# Patient Record
Sex: Female | Born: 1970 | Race: Black or African American | Hispanic: No | Marital: Married | Smoking: Current every day smoker
Health system: Southern US, Community
[De-identification: ages and names within clinical notes are randomized; demographics above are authoritative.]

---

## 2013-04-07 ENCOUNTER — Emergency Department (HOSPITAL_COMMUNITY)
Admission: EM | Admit: 2013-04-07 | Discharge: 2013-04-07 | Disposition: A | Discharge status: Home / Self Care | Payer: Self-pay | Attending: Emergency Medicine | Admitting: Emergency Medicine

## 2013-04-07 ENCOUNTER — Emergency Department (HOSPITAL_COMMUNITY): Payer: Self-pay

## 2013-04-07 ENCOUNTER — Encounter (HOSPITAL_COMMUNITY): Payer: Self-pay | Admitting: Emergency Medicine

## 2013-04-07 DIAGNOSIS — Z880 Allergy status to penicillin: Secondary | ICD-10-CM | POA: Insufficient documentation

## 2013-04-07 DIAGNOSIS — F17200 Nicotine dependence, unspecified, uncomplicated: Secondary | ICD-10-CM | POA: Insufficient documentation

## 2013-04-07 DIAGNOSIS — R079 Chest pain, unspecified: Secondary | ICD-10-CM | POA: Insufficient documentation

## 2013-04-07 LAB — CBC WITH DIFFERENTIAL/PLATELET
Basophils Absolute: 0 10*3/uL (ref 0.0–0.1)
Basophils Relative: 0 % (ref 0–1)
Eosinophils Absolute: 0 10*3/uL (ref 0.0–0.7)
Eosinophils Relative: 0 % (ref 0–5)
HCT: 40.9 % (ref 36.0–46.0)
Hemoglobin: 14.2 g/dL (ref 12.0–15.0)
Lymphocytes Relative: 19 % (ref 12–46)
Lymphs Abs: 1.8 10*3/uL (ref 0.7–4.0)
MCH: 30.9 pg (ref 26.0–34.0)
MCHC: 34.7 g/dL (ref 30.0–36.0)
MCV: 88.9 fL (ref 78.0–100.0)
Monocytes Absolute: 0.8 10*3/uL (ref 0.1–1.0)
Monocytes Relative: 8 % (ref 3–12)
Neutro Abs: 6.9 10*3/uL (ref 1.7–7.7)
Neutrophils Relative %: 72 % (ref 43–77)
Platelets: 212 10*3/uL (ref 150–400)
RBC: 4.6 MIL/uL (ref 3.87–5.11)
RDW: 12.8 % (ref 11.5–15.5)
WBC: 9.6 10*3/uL (ref 4.0–10.5)

## 2013-04-07 LAB — BASIC METABOLIC PANEL
BUN: 13 mg/dL (ref 6–23)
CO2: 27 mEq/L (ref 19–32)
Calcium: 10.1 mg/dL (ref 8.4–10.5)
Chloride: 103 mEq/L (ref 96–112)
Creatinine, Ser: 0.97 mg/dL (ref 0.50–1.10)
GFR calc Af Amer: 82 mL/min — ABNORMAL LOW (ref >90)
GFR calc non Af Amer: 71 mL/min — ABNORMAL LOW (ref >90)
Glucose, Bld: 83 mg/dL (ref 70–99)
Potassium: 3.8 mEq/L (ref 3.7–5.3)
Sodium: 142 mEq/L (ref 137–147)

## 2013-04-07 LAB — TROPONIN I: Troponin I: <0.3 ng/mL (ref <0.30)

## 2013-04-07 MED ORDER — ASPIRIN 325 MG PO TABS
650.0000 mg | ORAL_TABLET | Freq: Every day | ORAL | Status: DC
  Administered 2013-04-07: 650 mg via ORAL
  Filled 2013-04-07: qty 2

## 2013-04-07 MED ORDER — OXYCODONE-ACETAMINOPHEN 5-325 MG PO TABS
1.0000 | ORAL_TABLET | Freq: Once | ORAL | Status: AC
  Administered 2013-04-07: 1 via ORAL
  Filled 2013-04-07: qty 1

## 2013-04-07 NOTE — ED Notes (Signed)
The pt has had.  Lt breast pain since 1400 today no sob no nausea no dizziness.  She is also c/o a headsche

## 2013-04-07 NOTE — ED Notes (Signed)
Patient transported to X-ray 

## 2013-04-07 NOTE — ED Notes (Signed)
Reports she was carrying bags at 1400 today when she began experiencing left sided chest pain.  Feels like squeezing.  Denies increased pain with inspiration.  Nothing makes it better/worse.

## 2013-04-07 NOTE — ED Notes (Signed)
Returned from xray

## 2013-04-07 NOTE — Discharge Instructions (Signed)
Chest Pain (Nonspecific) °It is often hard to give a specific diagnosis for the cause of chest pain. There is always a chance that your pain could be related to something serious, such as a heart attack or a blood clot in the lungs. You need to follow up with your caregiver for further evaluation. °CAUSES  °· Heartburn. °· Pneumonia or bronchitis. °· Anxiety or stress. °· Inflammation around your heart (pericarditis) or lung (pleuritis or pleurisy). °· A blood clot in the lung. °· A collapsed lung (pneumothorax). It can develop suddenly on its own (spontaneous pneumothorax) or from injury (trauma) to the chest. °· Shingles infection (herpes zoster virus). °The chest wall is composed of bones, muscles, and cartilage. Any of these can be the source of the pain. °· The bones can be bruised by injury. °· The muscles or cartilage can be strained by coughing or overwork. °· The cartilage can be affected by inflammation and become sore (costochondritis). °DIAGNOSIS  °Lab tests or other studies, such as X-rays, electrocardiography, stress testing, or cardiac imaging, may be needed to find the cause of your pain.  °TREATMENT  °· Treatment depends on what may be causing your chest pain. Treatment may include: °· Acid blockers for heartburn. °· Anti-inflammatory medicine. °· Pain medicine for inflammatory conditions. °· Antibiotics if an infection is present. °· You may be advised to change lifestyle habits. This includes stopping smoking and avoiding alcohol, caffeine, and chocolate. °· You may be advised to keep your head raised (elevated) when sleeping. This reduces the chance of acid going backward from your stomach into your esophagus. °· Most of the time, nonspecific chest pain will improve within 2 to 3 days with rest and mild pain medicine. °HOME CARE INSTRUCTIONS  °· If antibiotics were prescribed, take your antibiotics as directed. Finish them even if you start to feel better. °· For the next few days, avoid physical  activities that bring on chest pain. Continue physical activities as directed. °· Do not smoke. °· Avoid drinking alcohol. °· Only take over-the-counter or prescription medicine for pain, discomfort, or fever as directed by your caregiver. °· Follow your caregiver's suggestions for further testing if your chest pain does not go away. °· Keep any follow-up appointments you made. If you do not go to an appointment, you could develop lasting (chronic) problems with pain. If there is any problem keeping an appointment, you must call to reschedule. °SEEK MEDICAL CARE IF:  °· You think you are having problems from the medicine you are taking. Read your medicine instructions carefully. °· Your chest pain does not go away, even after treatment. °· You develop a rash with blisters on your chest. °SEEK IMMEDIATE MEDICAL CARE IF:  °· You have increased chest pain or pain that spreads to your arm, neck, jaw, back, or abdomen. °· You develop shortness of breath, an increasing cough, or you are coughing up blood. °· You have severe back or abdominal pain, feel nauseous, or vomit. °· You develop severe weakness, fainting, or chills. °· You have a fever. °THIS IS AN EMERGENCY. Do not wait to see if the pain will go away. Get medical help at once. Call your local emergency services (911 in [place] Do not drive yourself to the hospital. °MAKE SURE YOU:  °· Understand these instructions. °· Will watch your condition. °· Will get help right away if you are not doing well or get worse. °Document Released: 10/20/2004 Document Revised: 04/04/2011 Document Reviewed: 08/16/2007 °ExitCare® Patient Information ©2014 [employer],   LLC. ° ° ° °Emergency Department Resource Guide °1) Find a Doctor and Pay Out of Pocket °Although you won't have to find out who is covered by your insurance plan, it is a good idea to ask around and get recommendations. You will then need to call the office and see if the doctor you have chosen will accept you as a new  patient and what types of options they offer for patients who are self-pay. Some doctors offer discounts or will set up payment plans for their patients who do not have insurance, but you will need to ask so you aren't surprised when you get to your appointment. ° °2) Contact Your Local Health Department °Not all health departments have doctors that can see patients for sick visits, but many do, so it is worth a call to see if yours does. If you don't know where your local health department is, you can check in your phone book. The CDC also has a tool to help you locate your state's health department, and many state websites also have listings of all of their local health departments. ° °3) Find a Walk-in Clinic °If your illness is not likely to be very severe or complicated, you may want to try a walk in clinic. These are popping up all over the country in pharmacies, drugstores, and shopping centers. They're usually staffed by nurse practitioners or physician assistants that have been trained to treat common illnesses and complaints. They're usually fairly quick and inexpensive. However, if you have serious medical issues or chronic medical problems, these are probably not your best option. ° °No Primary Care Doctor: °- Call [facility] at  832-8000 - they can help you locate a primary care doctor that  accepts your insurance, provides certain services, etc. °- Physician Referral Service- 1-800-533-3463 ° °Chronic Pain Problems: °Organization         Address  Phone   Notes  °[facility]  (336) 297-2271 Patients need to be referred by their primary care doctor.  ° °Medication Assistance: °Organization         Address  Phone   Notes  °[place] [facility] [address] °[place], [place] [address] (336) 641-8030 --Must be a resident of [place] °-- Must have NO insurance coverage whatsoever (no Medicaid/ Medicare, etc.) °-- The pt. MUST have a primary  care doctor that directs their care regularly and follows them in the community °  °[facility]  (866) 331-1348   °[employer]  (888) 892-1162   ° °Agencies that provide inexpensive medical care: °Organization         Address  Phone   Notes  °[facility]  (336) 832-8035   °[facility]    (336) 832-7272   °[facility] [address] °[place], [place] [address] (336) 832-4777   °[facility] [address], °[place] (336) 271-4999   °[facility]    (336) 373-0678   °[facility]    (336) 272-1050   °[facility] ° [address], [place] Phone:  (336) 832-4444, Fax:  (336) 832-4440 Hours of Operation:  9 am - 6 pm, M-F.  Also accepts Medicaid/Medicare and self-pay.  °[facility] ° [address], [place] Phone: (336) 832-3150, Fax: (336) 832-3151. Hours of Operation:  8:30 am - 5:30 pm, M-F.  Also accepts Medicaid and self-pay.  °  [facility] [place] [address], [place] Phone: (336) 878-6027   °[facility] [address], [place], [place] (336)723-1848, Ext. 123 Mondays & Thursdays: 7-9 AM.  First 15 patients are seen on a first come, first serve basis. °  ° °Medicaid-accepting [place] Providers: ° °Organization         Address  Phone   Notes  °[facility] [address], [place] (336) 641-2100 Also accepts self-pay patients.  °[facility] [address], [place] ° (336) 856-9996   °[facility] [address], [place] (336) 288-8857   °[facility] [address] [place] [address], [place] (336) 299-7000   °Veita Bland [address], [place]  ° (336) 373-1557 Only accepts [place] Access Medicaid patients after they have their name applied to their card.  ° °Self-Pay (no insurance) in [place]: ° °Organization         Address  Phone   Notes  °Sickle Cell Patients, [facility] [address], [place] (336) 832-1970   °[facility] [address], [place] (336) 832-4400   ° [facility] ° [address],  (336) 992-4800   °[facility]/Dr. Osei-Bonsu ° [address] [place] [address], [place] or [address], [place] (336) 841-8500 Phone number for both [place] and [place] locations is the same.  °[facility] [address], [place] (336) 299-0000   °[facility] [address] [place] [address], [place] or [address] (336) 852-7530 °(336) 878-2260   °[facility] [address], [place] (336) 350-1642, phone; (336) 294-5005, fax Sees patients 1st and 3rd Saturday of every month.  Must not qualify for public or private insurance (i.e. Medicaid, Medicare, [place] Health Choice, Veterans' Benefits) • Household income should be no more than 200% of the poverty level •The clinic cannot treat you if you are pregnant or think you are pregnant • Sexually transmitted diseases are not treated at the clinic.  ° ° °Dental Care: °Organization         Address  Phone  Notes  °[facility] [address], [place] (336) 641-6152 Accepts children up to age 21 who are enrolled in Medicaid or [place] Health Choice; pregnant women with a Medicaid card; and children who have applied for Medicaid or [place] Health Choice, but were declined, whose parents can pay a reduced fee at time of service.  °[facility] [place]  [address], [place] (336) 641-7733 Accepts children up to age 21 who are enrolled in Medicaid or [place] Health Choice; pregnant women with a Medicaid card; and children who have applied for Medicaid or [place] Health Choice, but were declined, whose parents can  pay a reduced fee at time of service.  °[facility] ° [address],  (336) 641-4533 Patients are seen by appointment only. Walk-ins are not accepted. [facility] will see patients 18 years of age and older. °Monday - Tuesday (8am-5pm) °Most Wednesdays (8:30-5pm) °$30 per visit, cash only  °[facility] ° [address], [place] (336) 641-4533 Patients are seen by appointment only. Walk-ins are not accepted. [facility] will see patients 18 years of   age and older. °One Wednesday Evening (Monthly: Volunteer Based).  $30 per visit, cash only  °[facility]  (919) 537-3737 for adults; Children under age 4, call Graduate Pediatric Dentistry at ([phone]) [phone]. Children aged 4-14, please call (919) [phone] to request a pediatric application. ° Dental services are provided in all areas of dental care including fillings, crowns and bridges, complete and partial dentures, implants, gum treatment, root canals, and extractions. Preventive care is also provided. Treatment is provided to both adults and children. °Patients are selected via a lottery and there is often a waiting list. °  °[facility] [address], °[place] ° (336) 763-8833 [email or web address] °  °[facility] [address], [place], [place] (336)723-1848, Ext. 123 Second and Fourth Thursday of each month, opens at 6:30 AM; Clinic ends at 9 AM.  Patients are seen on a first-come first-served basis, and a limited number are seen during each clinic.  ° °[facility] ° [address], [place], [place] (336) 723-7904   Eligibility Requirements °You must have lived in [place], [place], or [place] counties for at least the last three months. °  You cannot be eligible for state or federal sponsored healthcare insurance, including Veterans Administration, Medicaid, or Medicare. °  You generally cannot be eligible for healthcare  insurance through your employer.  °  How to apply: °Eligibility screenings are held every Tuesday and Wednesday afternoon from 1:00 pm until 4:00 pm. You do not need an appointment for the interview!  °[facility] [address], [place], [place] 336-631-2330   °[facility]  336-342-8273   °[facility]  336-703-3100   °[facility]  336-570-6415   ° °Behavioral Health Resources in the Community: °Intensive Outpatient Programs °Organization         Address  Phone  Notes  °[facility] [address], [place], [place] 336-878-6098   °[facility] [address], [place], [place] 336-832-9800   °[facility] [address], [place], [place] ° 336-882-2125   °[facility] [address],  °[place], [place] 1-800-853-5163 or 336-641-4981   °Substance Abuse Resources °Organization         Address  Phone  Notes  °[facility]  336-882-2125   °[facility]  336-784-9470   °[facility]  336-285-9073   °[facility]  336-845-3988   °Residential & Outpatient Substance Abuse Program  1-800-659-3381   °Psychological Services °Organization         Address  Phone  Notes  ° Health  336- 832-9600   °[facility]  336- 378-7881   °[facility] [address], [place] 1-800-853-5163 or 336-641-4981   ° °Mobile Crisis Teams °Organization         Address  Phone  Notes  °[facility], Mobile Crisis Care Unit  1-877-626-1772   °Assertive °[facility] ° [address] [place], [place] 336-834-9664   °Sharon DeEsch [address] °[place] 336-554-5454   ° °Self-Help/Support Groups °Organization         Address  Phone             Notes  °[facility]  - variety of support groups  336- 373-1402 Call for more information  °Narcotics Anonymous (NA),  [facility] [address], °[place]   2   meetings at this location  ° °Residential Treatment Programs °Organization         Address  Phone  Notes  °[facility] [address],    °[place]  1-866-801-8205   °[facility] ° [address], [place], [place] 704-293-8524   °[facility] [address], [place] 336-845-3988 Admissions: 8am-3pm M-F  °[facility] [address]    °[place], [place] 336-841-1104   °[facility] [address], [place], [place] 336-379-7146   °[facility] [address]  °[place], [place] 336-285-9073   °[facility] Intensive Outpatient [address], [place], [place] 336-852-3033   °[facility] [address]  °[place], [place] 1-877-615-2722 or 336-784-9470   °[facility] [address] [place], [place] 336-227-7417 Accepts Medicaid  °[facility] [address]  °[place] 1-800-659-3381 Substance Abuse/Addiction Treatment  ° °[place] Behavioral Health Resources °Organization         Address  Phone  Notes  °[facility]  (888) 581-9988   °Julie Brannon, PhD [address] [place], [place]   (336) 349-5553 or (336) 951-0000   °[facility]   [address] °[place], [place] (336) 349-4454   °[facility] [address], [place], [place] (336) 342-8316 Insurance/Medicaid/sponsorship through [facility]  °[facility] [address]                                    [place], [place] (336) 342-8316 Therapy/tele-psych/case  °[facility] [address]  ° [place], [place] (336) 349-2233    °Dr. Arfeen  (336) 349-4544   °[facility] [place]  [employer] [place] [facility] 1) [address], [place] °2) [address], [place] °3)  [address], [place] (336) 349-3220 °(336) 342-7768 ° °(336) 342-8140    °[place] [facility] (336) 342-1394 or (336) 342-3537 (After Hours)    ° ° ° °

## 2013-04-09 NOTE — ED Provider Notes (Signed)
CSN: [id number]     Arrival date & time 04/07/13  1820 History   First MD Initiated Contact with Patient 04/07/13 1903     Chief Complaint  Patient presents with  . Chest Pain     (Consider location/radiation/quality/duration/timing/severity/associated sxs/prior Treatment) HPI  43 year old female with chest pain. Left anterior chest. Pain is reproducible with elevation of her left breast. Denies any trauma. Pain is sharp. No appreciable exacerbating or relieving factors otherwise. No cough. No shortness of breath. No fevers or chills. No unusual leg pain or swelling. Past history of similar symptoms. Has not noticed any change with exertion. No nausea, diaphoresis or palpitations.. Smoker. No history of CAD.  History reviewed. No pertinent past medical history. History reviewed. No pertinent past surgical history. No family history on file. History  Substance Use Topics  . Smoking status: Current Every Day Smoker  . Smokeless tobacco: Not on file  . Alcohol Use: Yes   OB History   Grav Para Term Preterm Abortions TAB SAB Ect Mult Living                 Review of Systems  All systems reviewed and negative, other than as noted in HPI.   Allergies  Penicillins  Home Medications  No current outpatient prescriptions on file. BP 122/87  Pulse 90  Temp(Src) 97 F (36.1 C) (Oral)  Resp 18  Ht 5\' 5"  (1.651 m)  Wt 231 lb (104.781 kg)  BMI 38.44 kg/m2  SpO2 96%  LMP 03/25/2013 Physical Exam  Nursing note and vitals reviewed. Constitutional: She appears well-developed and well-nourished. No distress.  Laying in bed. No acute distress. Obese.  HENT:  Head: Normocephalic and atraumatic.  Eyes: Conjunctivae are normal. Right eye exhibits no discharge. Left eye exhibits no discharge.  Neck: Neck supple.  Cardiovascular: Normal rate, regular rhythm and normal heart sounds.  Exam reveals no gallop and no friction rub.   No murmur heard. Pulmonary/Chest: Effort normal and  breath sounds normal. No respiratory distress.  Abdominal: Soft. She exhibits no distension. There is no tenderness.  Musculoskeletal: She exhibits no edema and no tenderness.  Lower extremities symmetric as compared to each other. No calf tenderness. Negative Homan's. No palpable cords.   Neurological: She is alert.  Skin: Skin is warm and dry.  Psychiatric: She has a normal mood and affect. Her behavior is normal. Thought content normal.    ED Course  Procedures (including critical care time) Labs Review Labs Reviewed  BASIC METABOLIC PANEL - Abnormal; Notable for the following:    GFR calc non Af Amer 71 (*)    GFR calc Af Amer 82 (*)    All other components within normal limits  TROPONIN I  CBC WITH DIFFERENTIAL   Imaging Review No results found.   EKG Interpretation   Date/Time:  Sunday April 07 2013 18:30:02 EDT Ventricular Rate:  109 PR Interval:  168 QRS Duration: 72 QT Interval:  338 QTC Calculation: 455 R Axis:   -13 Text Interpretation:  Sinus tachycardia Low voltage QRS Cannot rule out  Anterior infarct , age undetermined Abnormal ECG No old tracing to compare  Confirmed by [facility]  MD, Leonette MostHARLES [phone]([id number]) on 04/07/2013 6:35:25 PM      MDM   Final diagnoses:  Chest pain    43 year old female with chest pain. Doubt ACS, pulmonary embolism, infectious or dissection. Low suspicion for emergent process. Return precautions were discussed. Outpatient followup for stress test otherwise.    Raeford RazorStephen Journi Moffa, MD  04/09/13 2311 

## 2015-04-19 IMAGING — CR DG CHEST 2V
2 series · 2 of 2 positions shown · non-contrast
Comparison: None.

CLINICAL DATA: Chest pain

EXAM:
CHEST  2 VIEW

[w chest pa]
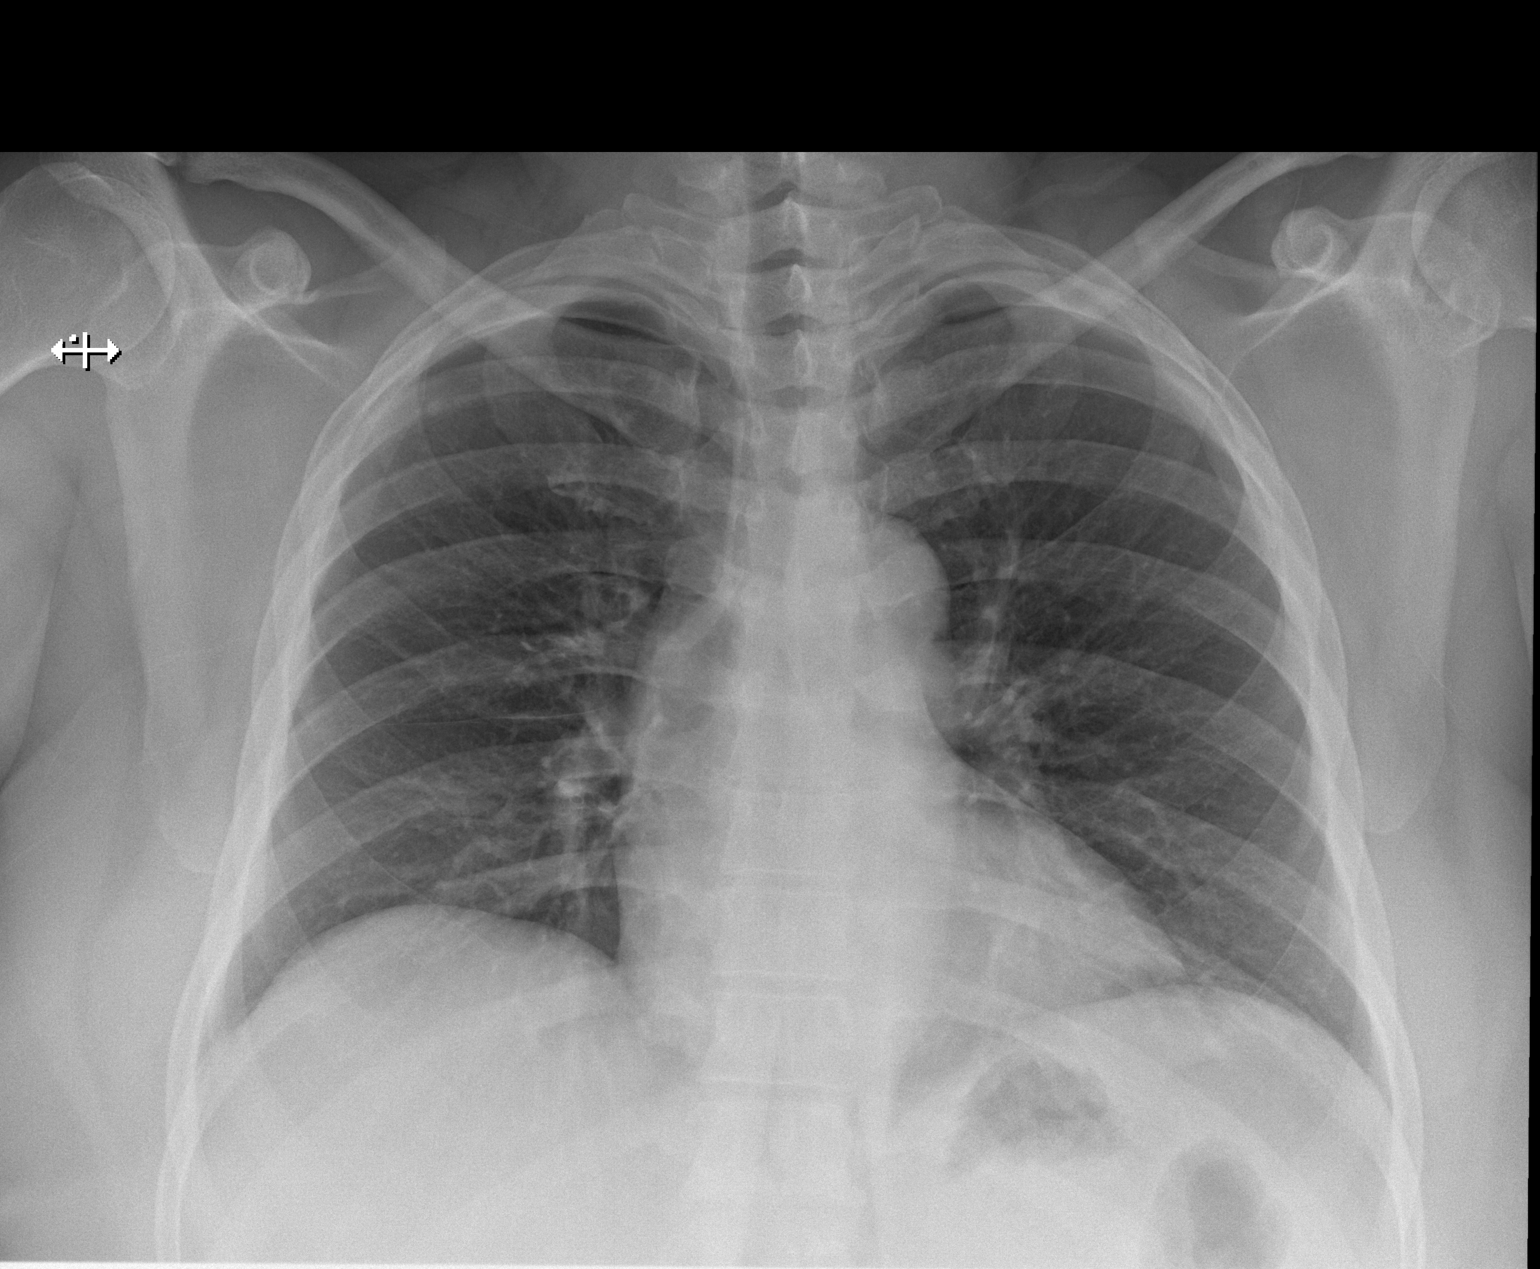

[w chest lat]
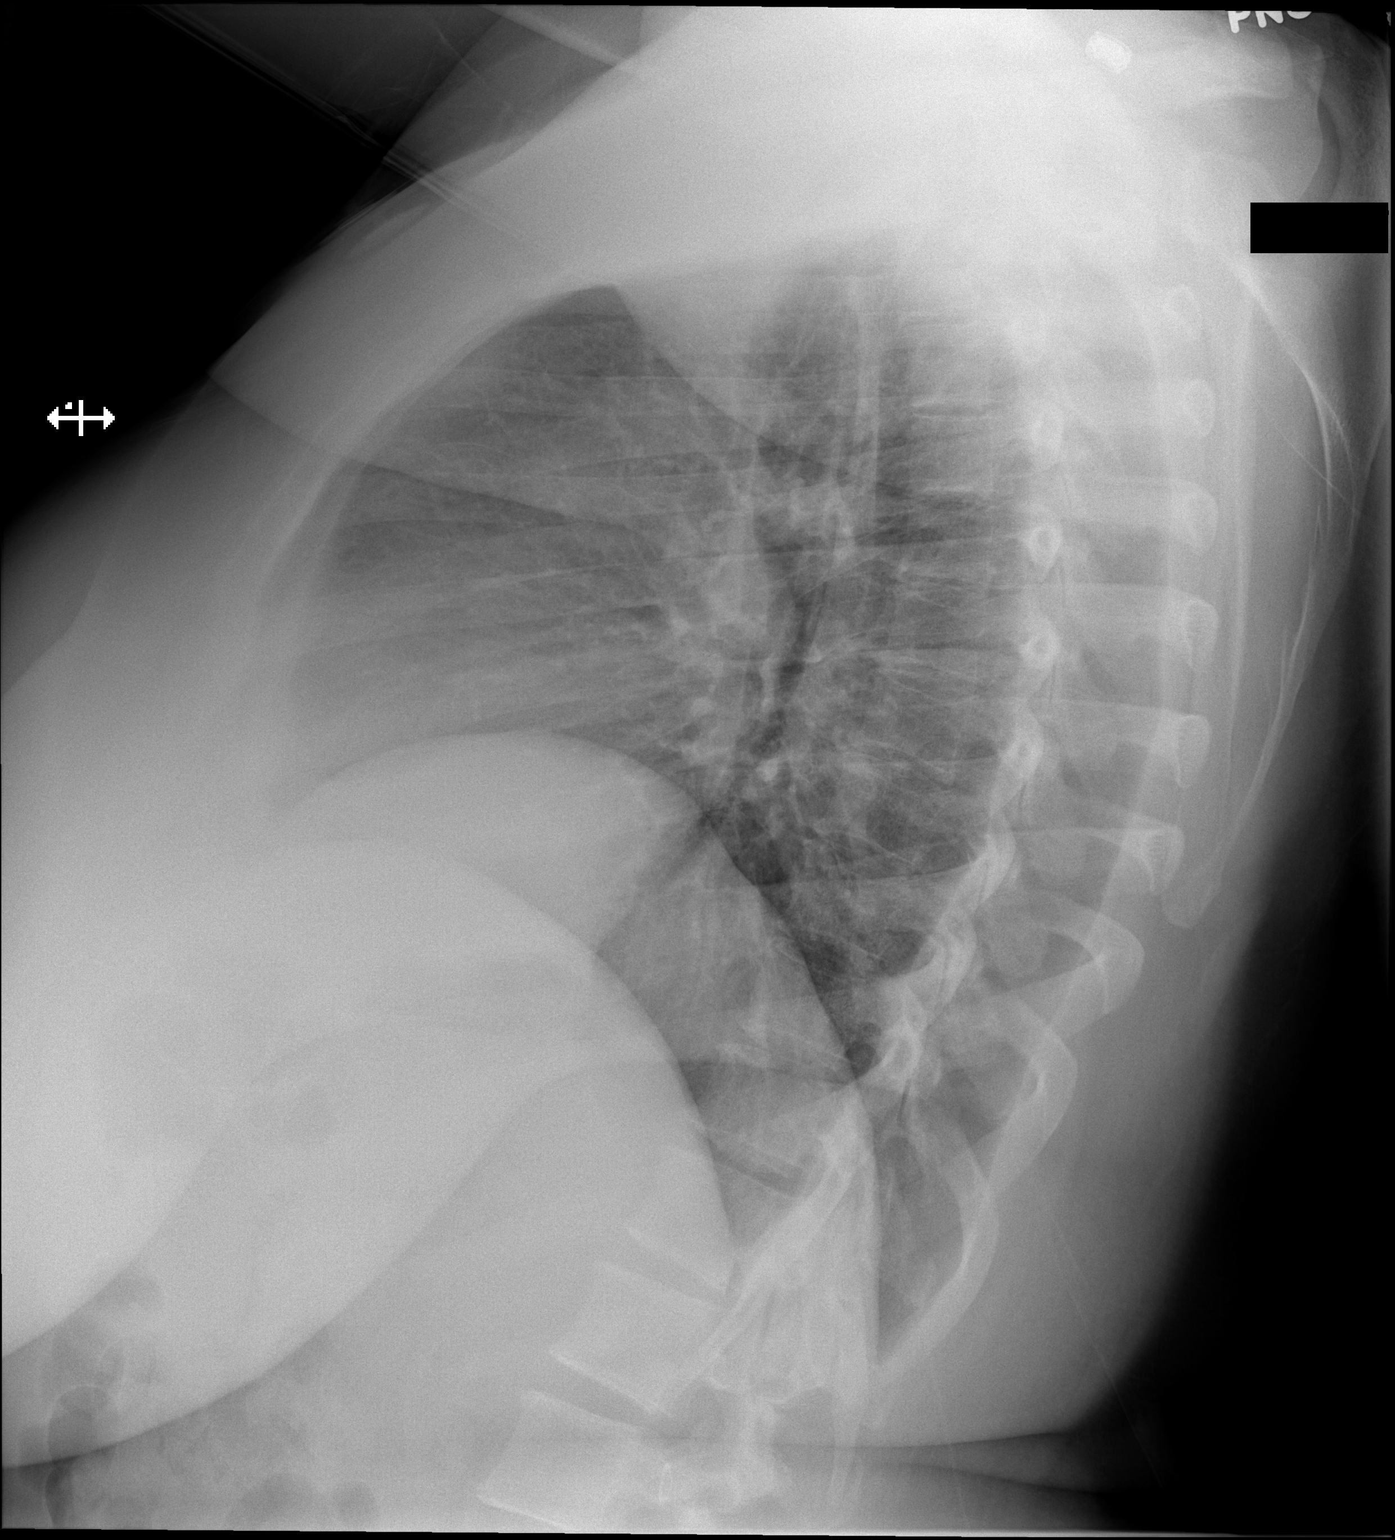

[2 of 2 positions shown; findings below may reference images not displayed]

FINDINGS: Lungs are clear. Heart size and pulmonary vascularity are normal. No
adenopathy. No pneumothorax. No bone lesions.
IMPRESSION: No abnormality noted.
